# Patient Record
Sex: Male | Born: 1960 | Race: Black or African American | Hispanic: No | Marital: Single | Smoking: Former smoker
Health system: Southern US, Community
[De-identification: ages and names within clinical notes are randomized; demographics above are authoritative.]

## PROBLEM LIST (undated history)

## (undated) DIAGNOSIS — S0990XA Unspecified injury of head, initial encounter: Secondary | ICD-10-CM

## (undated) DIAGNOSIS — K219 Gastro-esophageal reflux disease without esophagitis: Secondary | ICD-10-CM

## (undated) DIAGNOSIS — F419 Anxiety disorder, unspecified: Secondary | ICD-10-CM

## (undated) DIAGNOSIS — E785 Hyperlipidemia, unspecified: Secondary | ICD-10-CM

## (undated) DIAGNOSIS — I10 Essential (primary) hypertension: Secondary | ICD-10-CM

## (undated) HISTORY — DX: Unspecified injury of head, initial encounter: S09.90XA

## (undated) HISTORY — DX: Essential (primary) hypertension: I10

## (undated) HISTORY — DX: Anxiety disorder, unspecified: F41.9

## (undated) HISTORY — DX: Hyperlipidemia, unspecified: E78.5

## (undated) HISTORY — DX: Gastro-esophageal reflux disease without esophagitis: K21.9

---

## 1999-03-26 HISTORY — PX: KNEE ARTHROSCOPY W/ ORIF: SUR98

## 2016-04-06 LAB — TSH: TSH: 2.37 u[IU]/mL (ref 0.41–5.90)

## 2016-04-06 LAB — COMPLETE METABOLIC PANEL WITH GFR
Albumin, Serum: 4.6
BUN/Creatinine Ratio: 15
CHLORIDE, SERUM: 102
CO2: 23
Calcium, Ser: 9.3
Hemoglobin A1C: 5.9
Protein S Total: 7.1
VLDL Cholesterol, Calc: 103

## 2016-04-06 LAB — LIPID PANEL
Cholesterol: 176 mg/dL (ref 0–200)
HDL: 47 mg/dL (ref 35–70)
LDL Cholesterol: 103 mg/dL
TRIGLYCERIDES: 130 mg/dL (ref 40–160)

## 2016-04-06 LAB — BASIC METABOLIC PANEL
BUN: 15 mg/dL (ref 4–21)
CREATININE: 1 mg/dL (ref 0.6–1.3)
Glucose: 93 mg/dL
Potassium: 4.1 mmol/L (ref 3.4–5.3)
SODIUM: 141 mmol/L (ref 137–147)

## 2016-04-06 LAB — CBC AND DIFFERENTIAL
HEMOGLOBIN: 14.7 g/dL (ref 13.5–17.5)
Platelets: 261 10*3/uL (ref 150–399)
WBC: 5.7 10^3/mL

## 2016-04-06 LAB — HEPATIC FUNCTION PANEL
ALT: 58 U/L — AB (ref 10–40)
AST: 32 U/L (ref 14–40)
Alkaline Phosphatase: 83 U/L (ref 25–125)
BILIRUBIN, TOTAL: 0.3 mg/dL

## 2016-05-31 ENCOUNTER — Encounter: Payer: Self-pay | Admitting: Family Medicine

## 2016-05-31 ENCOUNTER — Ambulatory Visit (INDEPENDENT_AMBULATORY_CARE_PROVIDER_SITE_OTHER): Payer: Self-pay | Admitting: Family Medicine

## 2016-05-31 ENCOUNTER — Ambulatory Visit (INDEPENDENT_AMBULATORY_CARE_PROVIDER_SITE_OTHER): Payer: Medicaid Other

## 2016-05-31 DIAGNOSIS — E782 Mixed hyperlipidemia: Secondary | ICD-10-CM

## 2016-05-31 DIAGNOSIS — M898X1 Other specified disorders of bone, shoulder: Secondary | ICD-10-CM

## 2016-05-31 DIAGNOSIS — I10 Essential (primary) hypertension: Secondary | ICD-10-CM

## 2016-05-31 DIAGNOSIS — G8929 Other chronic pain: Secondary | ICD-10-CM

## 2016-05-31 DIAGNOSIS — M25561 Pain in right knee: Secondary | ICD-10-CM | POA: Diagnosis not present

## 2016-05-31 DIAGNOSIS — E785 Hyperlipidemia, unspecified: Secondary | ICD-10-CM | POA: Insufficient documentation

## 2016-05-31 DIAGNOSIS — M1711 Unilateral primary osteoarthritis, right knee: Secondary | ICD-10-CM | POA: Diagnosis not present

## 2016-05-31 DIAGNOSIS — R6882 Decreased libido: Secondary | ICD-10-CM

## 2016-05-31 DIAGNOSIS — K219 Gastro-esophageal reflux disease without esophagitis: Secondary | ICD-10-CM

## 2016-05-31 MED ORDER — SIMVASTATIN 40 MG PO TABS: 40.0000 mg | ORAL_TABLET | Freq: Every day | ORAL | 1 refills | Status: AC

## 2016-05-31 MED ORDER — GABAPENTIN 300 MG PO CAPS: 300.0000 mg | ORAL_CAPSULE | Freq: Three times a day (TID) | ORAL | 3 refills | Status: DC

## 2016-05-31 MED ORDER — OMEPRAZOLE 20 MG PO CPDR: 20.0000 mg | DELAYED_RELEASE_CAPSULE | Freq: Every day | ORAL | 3 refills | Status: DC

## 2016-05-31 MED ORDER — LOSARTAN POTASSIUM 50 MG PO TABS: 50.0000 mg | ORAL_TABLET | Freq: Every day | ORAL | 3 refills | Status: AC

## 2016-05-31 MED ORDER — GABAPENTIN 300 MG PO CAPS: 900.0000 mg | ORAL_CAPSULE | Freq: Three times a day (TID) | ORAL | 3 refills | Status: AC

## 2016-05-31 NOTE — Progress Notes (Addendum)
Edward Henry is a 56 y.o. male who presents to [facility]: Primary Care Sports Medicine today for establish care, discuss, HTN, HLD, GERD, Right knee pain, Right scapula pain, and low libedo.   Edward Henry was discharged from prison in September of 2017 after serving around 20 years. While in prison he sustained several injuries [facility] experiences. He suffered a tibial plateau fracture that required ORIF. He's had significant issues with this knee since he has had surgery. He's been told by somebody that the hardware is loose. He received a steroid injection in the right knee a few months ago by another doctor which only lasted for a few days. He notes chronic right knee pain. He's been taking gabapentin for this problem. Previously he was taking 900 mg 3 times a day. His [facility] a [facility] for gabapentin 300 mg 3 times daily. He would like to increase the dose of possible.  Hypertension: Patient has hypertension and has been taking lisinopril. He notes this tends to control his blood pressure but causes a cough. He denies chest pain or palpitations.  Hyperlipidemia: Patient takes simvastatin daily and feels well with no significant muscle aches or pains.  GERD: Patient takes omeprazole 20 mg daily which helps to control his GERD symptoms well.  Right scapula pain: Patient suffered a scapula fracture in 2001 which was treated conservatively. He notes continued chronic pain in the right periscapular area worse with motion.  Decreased libido: Patient notes significantly decreased libido small testicles and decreased erections over the last several years. He notes this is bothersome.   Past Medical History:  Diagnosis Date  . Anxiety   . GERD (gastroesophageal reflux disease)   . Head trauma   . Hyperlipidemia   . Hypertension    Past Surgical History:  Procedure  Laterality Date  . KNEE ARTHROSCOPY W/ ORIF Right 2001   Tibial fx    Social History  Substance Use Topics  . Smoking status: Former Smoker    Types: Cigarettes    Quit date: 04/25/2006  . Smokeless tobacco: Never Used  . Alcohol use Not on file   family history includes Diabetes in his mother; Hypertension in his father.  ROS as above: No new headache, visual changes, nausea, vomiting, diarrhea, constipation, dizziness, abdominal pain, skin rash, fevers, chills, night sweats, weight loss, swollen lymph nodes, body aches, joint swelling, muscle aches, chest pain, shortness of breath, mood changes, visual or auditory hallucinations.    Medications: Current Outpatient Prescriptions  Medication Sig Dispense Refill  . gabapentin (NEURONTIN) 300 MG capsule Take 3 capsules (900 mg total) by mouth 3 (three) times daily. 810 capsule 3  . omeprazole (PRILOSEC) 20 MG capsule Take 1 capsule (20 mg total) by mouth daily. 90 capsule 3  . prazosin (MINIPRESS) 5 MG capsule Take 5 mg by mouth at bedtime.    . sertraline (ZOLOFT) 100 MG tablet Take 100 mg by mouth daily.    . simvastatin (ZOCOR) 40 MG tablet Take 1 tablet (40 mg total) by mouth daily. 90 tablet 1  . traZODone (DESYREL) 100 MG tablet Take 100 mg by mouth at bedtime.    Marland Kitchen losartan (COZAAR) 50 MG tablet Take 1 tablet (50 mg total) by mouth daily. 90 tablet 3   No current facility-administered medications for this visit.    Not on File  Health Maintenance Health Maintenance  Topic Date Due  . Hepatitis C Screening  24-Nov-1960  .  HIV Screening  11/05/1975  . TETANUS/TDAP  11/05/1979  . COLONOSCOPY  11/05/2010  . INFLUENZA VACCINE  10/24/2015     Exam:  BP (!) 136/92   Pulse 98   Ht 6\' 1"  (1.854 m)   Wt 264 lb (119.7 kg)   SpO2 97%   BMI 34.83 kg/m   Gen: Well NAD HEENT: EOMI,  MMM Lungs: Normal work of breathing. CTABL Heart: RRR no MRG Abd: NABS, Soft. Nondistended, Nontender Exts: Brisk capillary refill, warm and  well perfused.  Right knee: Large mature scar along the anterior aspect of the knee with mild effusion present. Decreased right-sided quadriceps bulk. Range of motion 5-100 with crepitations on extension. Patient ambulates with an antalgic gait using a cane.  Depression screen PHQ 2/9 05/31/2016  Decreased Interest 3  Down, Depressed, Hopeless 3  PHQ - 2 Score 6  Altered sleeping 3  Tired, decreased energy 3  Change in appetite 2  Feeling bad or failure about yourself  1  Trouble concentrating 3  Moving slowly or fidgety/restless 3  Suicidal thoughts 0  PHQ-9 Score 21     No results found for this or any previous visit (from the past 72 hour(s)). No results found.    Assessment and Plan: 56 y.o. male with  Right knee pain: Status post plateau fracture with ORIF. X-ray pending anticipate severe end-stage DJD. Patient will likely require TKR. Patient will return to clinic in one week to go over results. Plan to increase gabapentin to 900 mg 3 times daily as he was tolerating this previously. Follow-up in one week.  Scapula pain: Status post Fracture x-ray pending. Refer to physical therapy. Recheck in 1 week.  Hypertension: Blood pressure not well-controlled. Plan to switch to losartan 50 mg daily and check basic fasting labs in the near future. Follow-up in one week.  Hyperlipidemia: Continue current regimen check fasting lipids follow-up in one week.  GERD: Continue current regimen.  Psych: Patient has been seen a community psychiatrist recently for his psychiatric disorders following his incarceration. It looks like he's being treated for insomnia depression and possibly anxiety. Dr. Lilyan Punt is prescribing zoloft, trazodone and prazosin. Will try to obtain medical records.    Orders Placed This Encounter  Procedures  . DG Knee 1-2 Views Left    Standing Status:   Future    Number of Occurrences:   1    Standing Expiration Date:   08/01/2017    Order Specific  Question:   Reason for Exam (SYMPTOM  OR DIAGNOSIS REQUIRED)    Answer:   For use with right knee x-ray, bilateral AP and Rosenberg standing.    Order Specific Question:   Preferred imaging location?    Answer:   [facility]  . DG Knee Complete 4 Views Right    Please include patellar sunrise, lateral, and weightbearing bilateral AP and bilateral rosenberg views    Standing Status:   Future    Number of Occurrences:   1    Standing Expiration Date:   07/31/2017    Order Specific Question:   Reason for exam:    Answer:   Please include patellar sunrise, lateral, and weightbearing bilateral AP and bilateral rosenberg views    Comments:   Please include patellar sunrise, lateral, and weightbearing bilateral AP and bilateral rosenberg views    Order Specific Question:   Preferred imaging location?    Answer:   [facility]  . DG Scapula Right    Standing Status:  Future    Number of Occurrences:   1    Standing Expiration Date:   07/31/2017    Order Specific Question:   Reason for Exam (SYMPTOM  OR DIAGNOSIS REQUIRED)    Answer:   eval old fracture    Order Specific Question:   [facility]?    [facility]  . CBC  . COMPLETE METABOLIC PANEL WITH GFR  . Lipid Panel w/reflex Direct LDL  . VITAMIN D 25 Hydroxy (Vit-D Deficiency, Fractures)  . HIV antibody  . Testosterone  . TSH  . PSA  . Ambulatory referral to Physical Therapy    Referral Priority:   Routine    Referral Type:   Physical Medicine    Referral Reason:   Specialty Services Required    Requested Specialty:   Physical Therapy    Number of Visits Requested:   1   Meds ordered this encounter  Medications  . simvastatin (ZOCOR) 40 MG tablet    Sig: Take 1 tablet (40 mg total) by mouth daily.    Dispense:  90 tablet    Refill:  1  . losartan (COZAAR) 50 MG tablet    Sig: Take 1 tablet (50 mg total) by mouth daily.    Dispense:  90 tablet    Refill:  3  . DISCONTD:  gabapentin (NEURONTIN) 300 MG capsule    Sig: Take 1 capsule (300 mg total) by mouth 3 (three) times daily. One tab PO qHS for a week, then BID for a week, then TID. May double weekly to a max of 3,600mg /day    Dispense:  270 capsule    Refill:  3  . omeprazole (PRILOSEC) 20 MG capsule    Sig: Take 1 capsule (20 mg total) by mouth daily.    Dispense:  90 capsule    Refill:  3  . gabapentin (NEURONTIN) 300 MG capsule    Sig: Take 3 capsules (900 mg total) by mouth 3 (three) times daily.    Dispense:  810 capsule    Refill:  3  . sertraline (ZOLOFT) 100 MG tablet    Sig: Take 100 mg by mouth daily.  . traZODone (DESYREL) 100 MG tablet    Sig: Take 100 mg by mouth at bedtime.  . prazosin (MINIPRESS) 5 MG capsule    Sig: Take 5 mg by mouth at bedtime.     Discussed warning signs or symptoms. Please see discharge instructions. Patient expresses understanding.

## 2016-05-31 NOTE — Patient Instructions (Signed)
Thank you for coming in today. Get xray today on your way out.  Get fasting labs soon.   Return in 1 week.  We will go over all results and xrays and work on the other problems.   You can increase the gabapentin to 3 pills three times daily.   Return sooner if needed.

## 2016-06-03 ENCOUNTER — Telehealth: Payer: Self-pay | Admitting: *Deleted

## 2016-06-03 NOTE — Telephone Encounter (Signed)
Pre Authorization approved through [employer]. Approval # is [id number]. Called # in chart and its vm states "Dondra SpryGail". Left generic message that medication was approved through the insurance. The pharm that sent the PA was [facility] in [place] [address] so will call that pharm to let them know.

## 2016-06-06 LAB — COMPLETE METABOLIC PANEL WITH GFR
ALT: 39 U/L (ref 9–46)
AST: 28 U/L (ref 10–35)
Albumin: 4.3 g/dL (ref 3.6–5.1)
Alkaline Phosphatase: 81 U/L (ref 40–115)
BILIRUBIN TOTAL: 0.5 mg/dL (ref 0.2–1.2)
BUN: 11 mg/dL (ref 7–25)
CALCIUM: 8.9 mg/dL (ref 8.6–10.3)
CHLORIDE: 106 mmol/L (ref 98–110)
CO2: 29 mmol/L (ref 20–31)
CREATININE: 1.05 mg/dL (ref 0.70–1.33)
GFR, Est African American: >89 mL/min (ref >=60)
GFR, Est Non African American: 80 mL/min (ref >=60)
Glucose, Bld: 100 mg/dL — ABNORMAL HIGH (ref 65–99)
Potassium: 4.1 mmol/L (ref 3.5–5.3)
Sodium: 141 mmol/L (ref 135–146)
TOTAL PROTEIN: 6.7 g/dL (ref 6.1–8.1)

## 2016-06-06 LAB — TSH: TSH: 1.33 mIU/L (ref 0.40–4.50)

## 2016-06-06 LAB — CBC
HEMATOCRIT: 42.5 % (ref 38.5–50.0)
Hemoglobin: 14 g/dL (ref 13.2–17.1)
MCH: 29.5 pg (ref 27.0–33.0)
MCHC: 32.9 g/dL (ref 32.0–36.0)
MCV: 89.5 fL (ref 80.0–100.0)
MPV: 9.7 fL (ref 7.5–12.5)
PLATELETS: 248 10*3/uL (ref 140–400)
RBC: 4.75 MIL/uL (ref 4.20–5.80)
RDW: 13.7 % (ref 11.0–15.0)
WBC: 3.9 10*3/uL (ref 3.8–10.8)

## 2016-06-06 LAB — LIPID PANEL W/REFLEX DIRECT LDL
Cholesterol: 174 mg/dL (ref <200)
HDL: 38 mg/dL — AB (ref >40)
LDL-CHOLESTEROL: 113 mg/dL — AB
NON-HDL CHOLESTEROL (CALC): 136 mg/dL — AB (ref <130)
TRIGLYCERIDES: 120 mg/dL (ref <150)
Total CHOL/HDL Ratio: 4.6 Ratio (ref <5.0)

## 2016-06-06 LAB — TESTOSTERONE: TESTOSTERONE: 378 ng/dL (ref 250–827)

## 2016-06-06 LAB — PSA: PSA: 0.3 ng/mL (ref <=4.0)

## 2016-06-06 LAB — VITAMIN D 25 HYDROXY (VIT D DEFICIENCY, FRACTURES): Vit D, 25-Hydroxy: 18 ng/mL — ABNORMAL LOW (ref 30–100)

## 2016-06-06 LAB — HIV ANTIBODY (ROUTINE TESTING W REFLEX): HIV 1&2 Ab, 4th Generation: NONREACTIVE

## 2016-06-07 ENCOUNTER — Ambulatory Visit (INDEPENDENT_AMBULATORY_CARE_PROVIDER_SITE_OTHER): Payer: Medicaid Other | Admitting: Family Medicine

## 2016-06-07 ENCOUNTER — Encounter: Payer: Self-pay | Admitting: Family Medicine

## 2016-06-07 VITALS — BP 123/70 | HR 100 | Wt 267.0 lb

## 2016-06-07 DIAGNOSIS — G8929 Other chronic pain: Secondary | ICD-10-CM | POA: Diagnosis not present

## 2016-06-07 DIAGNOSIS — M25561 Pain in right knee: Secondary | ICD-10-CM

## 2016-06-07 NOTE — Patient Instructions (Addendum)
Thank you for coming in today. Work on right knee strength.  Do straight leg raises multiple times per day.  Try to use an exercise bike.  Follow up after the bone scan.  Continue medicines.   Recheck ortho.  Keep working on primary care doctor medicaid.   [facility] [address] [place], [place] [id number] [phone]

## 2016-06-07 NOTE — Progress Notes (Signed)
Edward Henry is a 56 y.o. male who presents to [facility]: Primary Care Sports Medicine today for follow-up right knee pain. Patient was seen last week to establish care for knee pain. Additionally his chronic health conditions were addressed but patient is aware that he will need to establish care with a clinic today except medicated for primary care reasons. I'm happy to see him for his orthopedic/sports medicine needs.  Right knee pain: As noted previously patient had what appears to be a proximal fibula and tibia plateau fracture treated surgically about 15 years ago while in prison. He had what seems like Limited physical therapy access.  He notes chronic right knee pain with decreased motion. He uses a cane to ambulate. He had an injection a few months ago by orthopedic surgeon which lasted a few days. He's been told that the hardware is loosen his knee based on physical exam findings several years ago but has not had proper diagnostic testing yet.   Past Medical History:  Diagnosis Date  . Anxiety   . GERD (gastroesophageal reflux disease)   . Head trauma   . Hyperlipidemia   . Hypertension    Past Surgical History:  Procedure Laterality Date  . KNEE ARTHROSCOPY W/ ORIF Right 2001   Tibial fx    Social History  Substance Use Topics  . Smoking status: Former Smoker    Types: Cigarettes    Quit date: 04/25/2006  . Smokeless tobacco: Never Used  . Alcohol use Not on file   family history includes Diabetes in his mother; Hypertension in his father.  ROS as above:  Medications: Current Outpatient Prescriptions  Medication Sig Dispense Refill  . gabapentin (NEURONTIN) 300 MG capsule Take 3 capsules (900 mg total) by mouth 3 (three) times daily. 810 capsule 3  . losartan (COZAAR) 50 MG tablet Take 1 tablet (50 mg total) by mouth daily. 90 tablet 3  . omeprazole (PRILOSEC) 20 MG  capsule Take 1 capsule (20 mg total) by mouth daily. 90 capsule 3  . prazosin (MINIPRESS) 5 MG capsule Take 5 mg by mouth at bedtime.    . sertraline (ZOLOFT) 100 MG tablet Take 100 mg by mouth daily.    . simvastatin (ZOCOR) 40 MG tablet Take 1 tablet (40 mg total) by mouth daily. 90 tablet 1  . traZODone (DESYREL) 100 MG tablet Take 100 mg by mouth at bedtime.     No current facility-administered medications for this visit.    Not on File  Health Maintenance Health Maintenance  Topic Date Due  . Hepatitis C Screening  30-Mar-1960  . TETANUS/TDAP  11/05/1979  . COLONOSCOPY  11/05/2010  . INFLUENZA VACCINE  10/24/2015  . HIV Screening  Completed     Exam:  BP 123/70   Pulse 100   Wt 267 lb (121.1 kg)   BMI 35.23 kg/m  Gen: Well NAD HEENT: EOMI,  MMM Lungs: Normal work of breathing. CTABL Heart: RRR no MRG Abd: NABS, Soft. Nondistended, Nontender Exts: Brisk capillary refill, warm and well perfused.  Right knee: Well-appearing mature scar along the anterior aspect of the knee. Significant decreased quadricep bulk. No effusion. Diffusely tender. Decreased motion 5-90 with crepitations on extension. Stable ligaments exam however patient guards significantly with exam.  Patient is significantly weak to quadricep strength. He is unable to complete a straight leg raise test.   Results for orders placed or performed in visit on 05/31/16 (from the past 72  hour(s))  CBC     Status: None   Collection Time: 06/05/16 11:29 AM  Result Value Ref Range   WBC 3.9 3.8 - 10.8 K/uL   RBC 4.75 4.20 - 5.80 MIL/uL   Hemoglobin 14.0 13.2 - 17.1 g/dL   HCT 96.042.5 45.438.5 - 09.850.0 %   MCV 89.5 80.0 - 100.0 fL   MCH 29.5 27.0 - 33.0 pg   MCHC 32.9 32.0 - 36.0 g/dL   RDW 11.913.7 14.711.0 - 82.915.0 %   Platelets 248 140 - 400 K/uL   MPV 9.7 7.5 - 12.5 fL  COMPLETE METABOLIC PANEL WITH GFR     Status: Abnormal   Collection Time: 06/05/16 11:29 AM  Result Value Ref Range   Sodium 141 135 - 146 mmol/L    Potassium 4.1 3.5 - 5.3 mmol/L   Chloride 106 98 - 110 mmol/L   CO2 29 20 - 31 mmol/L   Glucose, Bld 100 (H) 65 - 99 mg/dL   BUN 11 7 - 25 mg/dL   Creat 5.621.05 1.300.70 - 8.651.33 mg/dL    Comment:   For patients > or = 56 years of age: The upper reference limit for Creatinine is approximately 13% higher for people identified as African-American.      Total Bilirubin 0.5 0.2 - 1.2 mg/dL   Alkaline Phosphatase 81 40 - 115 U/L   AST 28 10 - 35 U/L   ALT 39 9 - 46 U/L   Total Protein 6.7 6.1 - 8.1 g/dL   Albumin 4.3 3.6 - 5.1 g/dL   Calcium 8.9 8.6 - 78.410.3 mg/dL   GFR, Est African American >89 >=60 mL/min   GFR, Est Non African American 80 >=60 mL/min  Lipid Panel w/reflex Direct LDL     Status: Abnormal   Collection Time: 06/05/16 11:29 AM  Result Value Ref Range   Cholesterol 174 <200 mg/dL   Triglycerides 696120 <295<150 mg/dL   HDL 38 (L) >28>40 mg/dL   Total CHOL/HDL Ratio 4.6 <5.0 Ratio   Non-HDL Cholesterol (Calc) 136 (H) <130 mg/dL    Comment:   For patients with diabetes plus 1 major ASCVD risk factor, treating to a non-HDL-C goal of <100 mg/dL (LDL-C of <41<70 mg/dL) is considered a therapeutic option.      LDL-Cholesterol 113 (H) mg/dL    Comment: Reference range: <100   Desirable range <100 mg/dL for patients with CHD or diabetes and <70 mg/dL for diabetic patients with known heart disease.     The Martin-Hopkins calculation is a validated novel method that provides better accuracy than the Friedwald equation in the estimation of LDL-C, particulary when TG levels are 150-400 mg/dL and LDL-C levels are lower than 70 mg/dL. Reference:  Horald PollenMartin SS et al.  Comparison of a Novel Method vs the Lauralee EvenerFriedwald Equation for Estimating Low-Density Lipoprotein Cholesterol Levels From the Standard Lipid Profile.  JAMA. 3244;010(272013;310(19): 2061-2068.   For additional information, please refer to http://education.[facility].com/faq/FAQ164 (This link is being provided for informational/educational  purposes only.)   VITAMIN D 25 Hydroxy (Vit-D Deficiency, Fractures)     Status: Abnormal   Collection Time: 06/05/16 11:29 AM  Result Value Ref Range   Vit D, 25-Hydroxy 18 (L) 30 - 100 ng/mL    Comment: Vitamin D Status           25-OH Vitamin D        Deficiency                <20 ng/mL  Insufficiency         20 - 29 ng/mL        Optimal             > or = 30 ng/mL   For 25-OH Vitamin D testing on patients on D2-supplementation and patients for whom quantitation of D2 and D3 fractions is required, the [facility] 25-OH VIT D, (D2,D3), LC/MS/MS is recommended: order code 16109 (patients > 2 yrs).   HIV antibody     Status: None   Collection Time: 06/05/16 11:29 AM  Result Value Ref Range   HIV 1&2 Ab, 4th Generation NONREACTIVE NONREACTIVE    Comment:   HIV-1 antigen and HIV-1/HIV-2 antibodies were not detected.  There is no laboratory evidence of HIV infection.   HIV-1/2 Antibody Diff        Not indicated. HIV-1 RNA, Qual TMA          Not indicated.     PLEASE NOTE: This information has been disclosed to you from records whose confidentiality may be protected by state law. If your state requires such protection, then the state law prohibits you from making any further disclosure of the information without the specific written consent of the person to whom it pertains, or as otherwise permitted by law. A general authorization for the release of medical or other information is NOT sufficient for this purpose.   The performance of this assay has not been clinically validated in patients less than 53 years old.   For additional information please refer to http://education.[facility].com/faq/FAQ106.  (This link is being provided for informational/educational purposes only.)     Testosterone     Status: None   Collection Time: 06/05/16 11:30 AM  Result Value Ref Range   Testosterone 378 250 - 827 ng/dL  TSH     Status: None   Collection Time: 06/05/16 11:30  AM  Result Value Ref Range   TSH 1.33 0.40 - 4.50 mIU/L  PSA     Status: None   Collection Time: 06/05/16 11:30 AM  Result Value Ref Range   PSA 0.3 <=4.0 ng/mL    Comment:   The total PSA value from this assay system is standardized against the WHO standard. The test result will be approximately 20% lower when compared to the equimolar-standardized total PSA (Beckman Coulter). Comparison of serial PSA results should be interpreted with this fact in mind.   This test was performed using the Siemens chemiluminescent method. Values obtained from different assay methods cannot be used interchangeably. PSA levels, regardless of value, should not be interpreted as absolute evidence of the presence or absence of disease.      No results found.  Study Result   CLINICAL DATA:  Chronic right knee pain with worsening  EXAM: RIGHT KNEE - COMPLETE 4+ VIEW  COMPARISON:  None.  FINDINGS: The patient is status post surgical plate and multiple screw fixation of the proximal tibia with grossly intact hardware. Old fracture deformity of the proximal fibula. Mild patellofemoral degenerative changes. Mild to moderate narrowing of the medial joint space compartment. Mild narrowing of the lateral joint space compartment. No large effusion.  IMPRESSION: Mild to moderate arthritis of the right knee without acute abnormality. Status post surgical plate and screw fixation of the proximal tibia with old fracture deformities of the proximal fibula and proximal tibia.   Electronically Signed   By: Jasmine Pang M.D.   On: 05/31/2016 14:21     Assessment and Plan: 56 y.o. 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 Plan for 3 phase bone scan to evaluate for hardware loosening. Return after exam. Plan patient will work on straight leg raise strength exercises as well as simple knee range of motion exercises on an exercise bike.   Orders Placed  This Encounter  Procedures  . NM Bone Scan 3 Phase    Standing Status:   Future    Standing Expiration Date:   08/07/2017    Order Specific Question:   Reason for Exam (SYMPTOM  OR DIAGNOSIS REQUIRED)    Answer:   eval hardware    Order Specific Question:   Preferred imaging location?    Answer:   [facility]   No orders of the defined types were placed in this encounter.    Discussed warning signs or symptoms. Please see discharge instructions. Patient expresses understanding.

## 2016-06-20 ENCOUNTER — Ambulatory Visit: Payer: Medicaid Other | Admitting: Physical Therapy

## 2016-06-25 ENCOUNTER — Encounter: Payer: Self-pay | Admitting: Family Medicine

## 2016-07-05 ENCOUNTER — Encounter (HOSPITAL_COMMUNITY)
Admission: RE | Admit: 2016-07-05 | Discharge: 2016-07-05 | Disposition: A | Discharge status: Home / Self Care | Payer: Medicaid Other | Source: Ambulatory Visit | Attending: Family Medicine | Admitting: Family Medicine

## 2016-07-05 DIAGNOSIS — M25561 Pain in right knee: Secondary | ICD-10-CM | POA: Insufficient documentation

## 2016-07-05 DIAGNOSIS — G8929 Other chronic pain: Secondary | ICD-10-CM | POA: Diagnosis present

## 2016-07-05 MED ORDER — TECHNETIUM TC 99M MEDRONATE IV KIT
25.0000 | PACK | Freq: Once | INTRAVENOUS | Status: AC | PRN
  Administered 2016-07-05: 25 via INTRAVENOUS

## 2016-11-27 ENCOUNTER — Other Ambulatory Visit: Payer: Self-pay

## 2016-11-27 MED ORDER — OMEPRAZOLE 20 MG PO CPDR: 20.0000 mg | DELAYED_RELEASE_CAPSULE | Freq: Every day | ORAL | 3 refills | Status: AC

## 2018-04-29 IMAGING — DX DG SCAPULA*R*
2 series · 2 of 2 positions shown · non-contrast
Comparison: None.

CLINICAL DATA: Injured scapula in 9332 and had a fracture

EXAM:
RIGHT SCAPULA - 2+ VIEWS

[scapula ap]
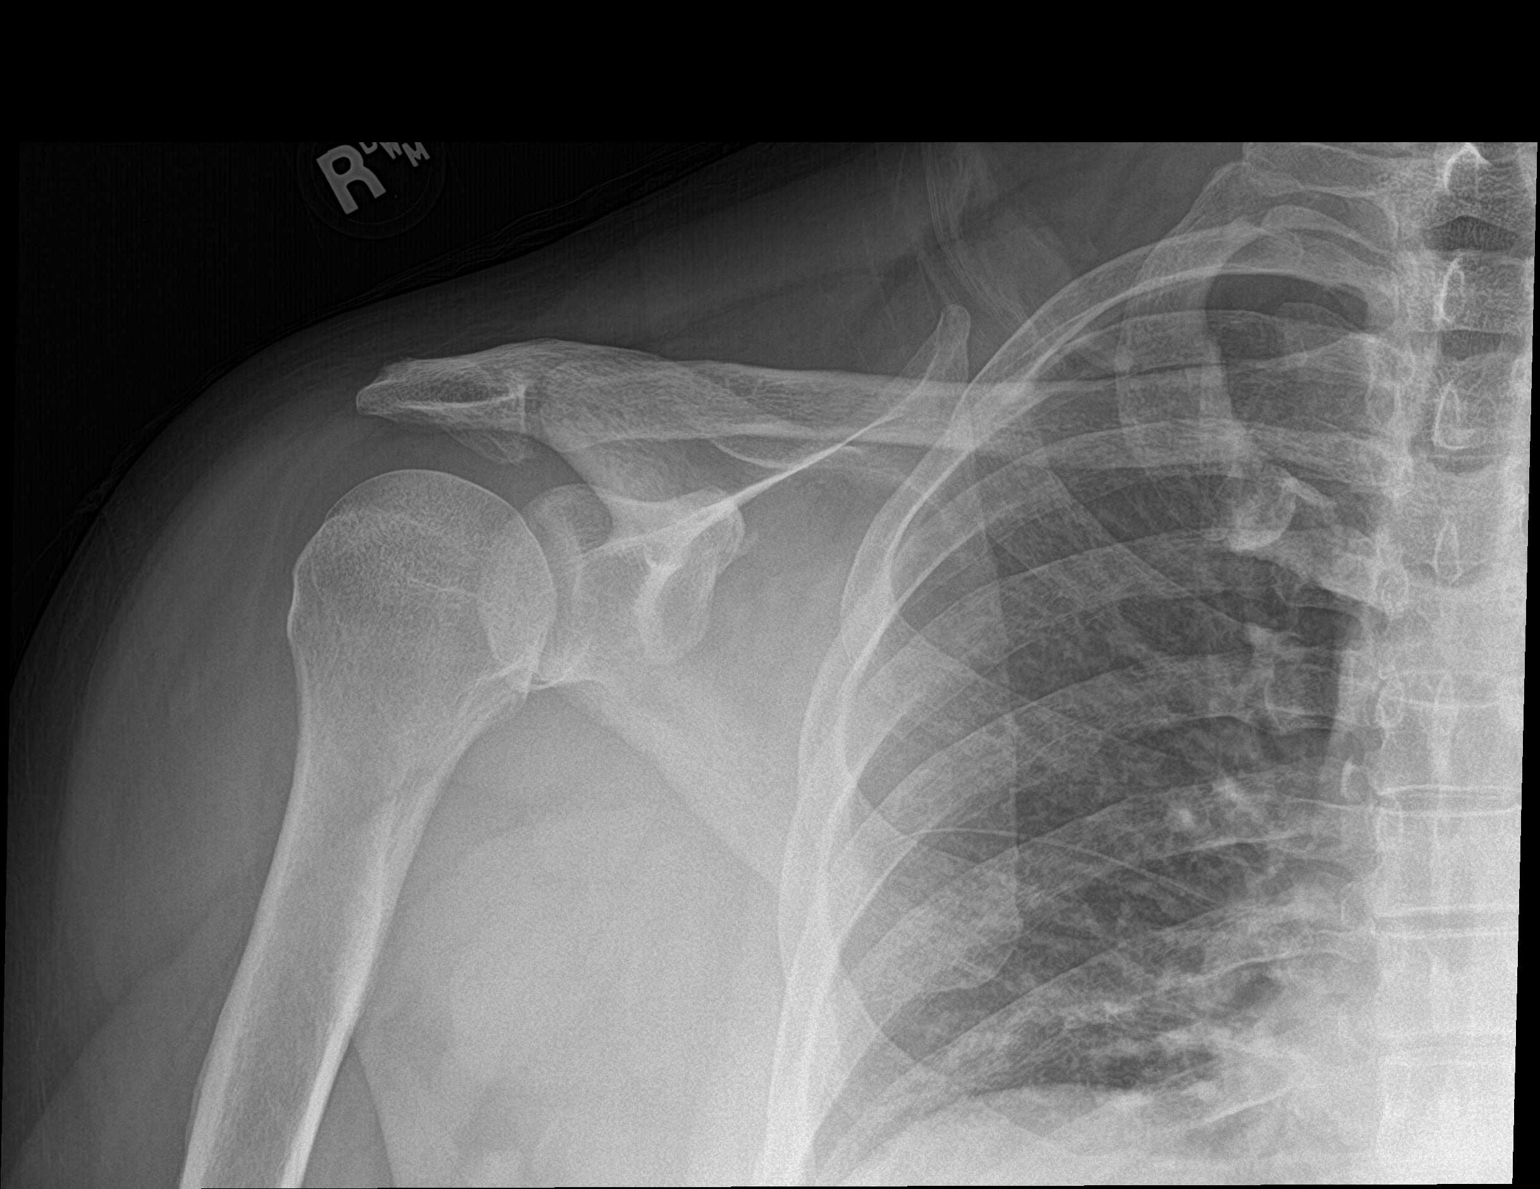

[scapula lat]
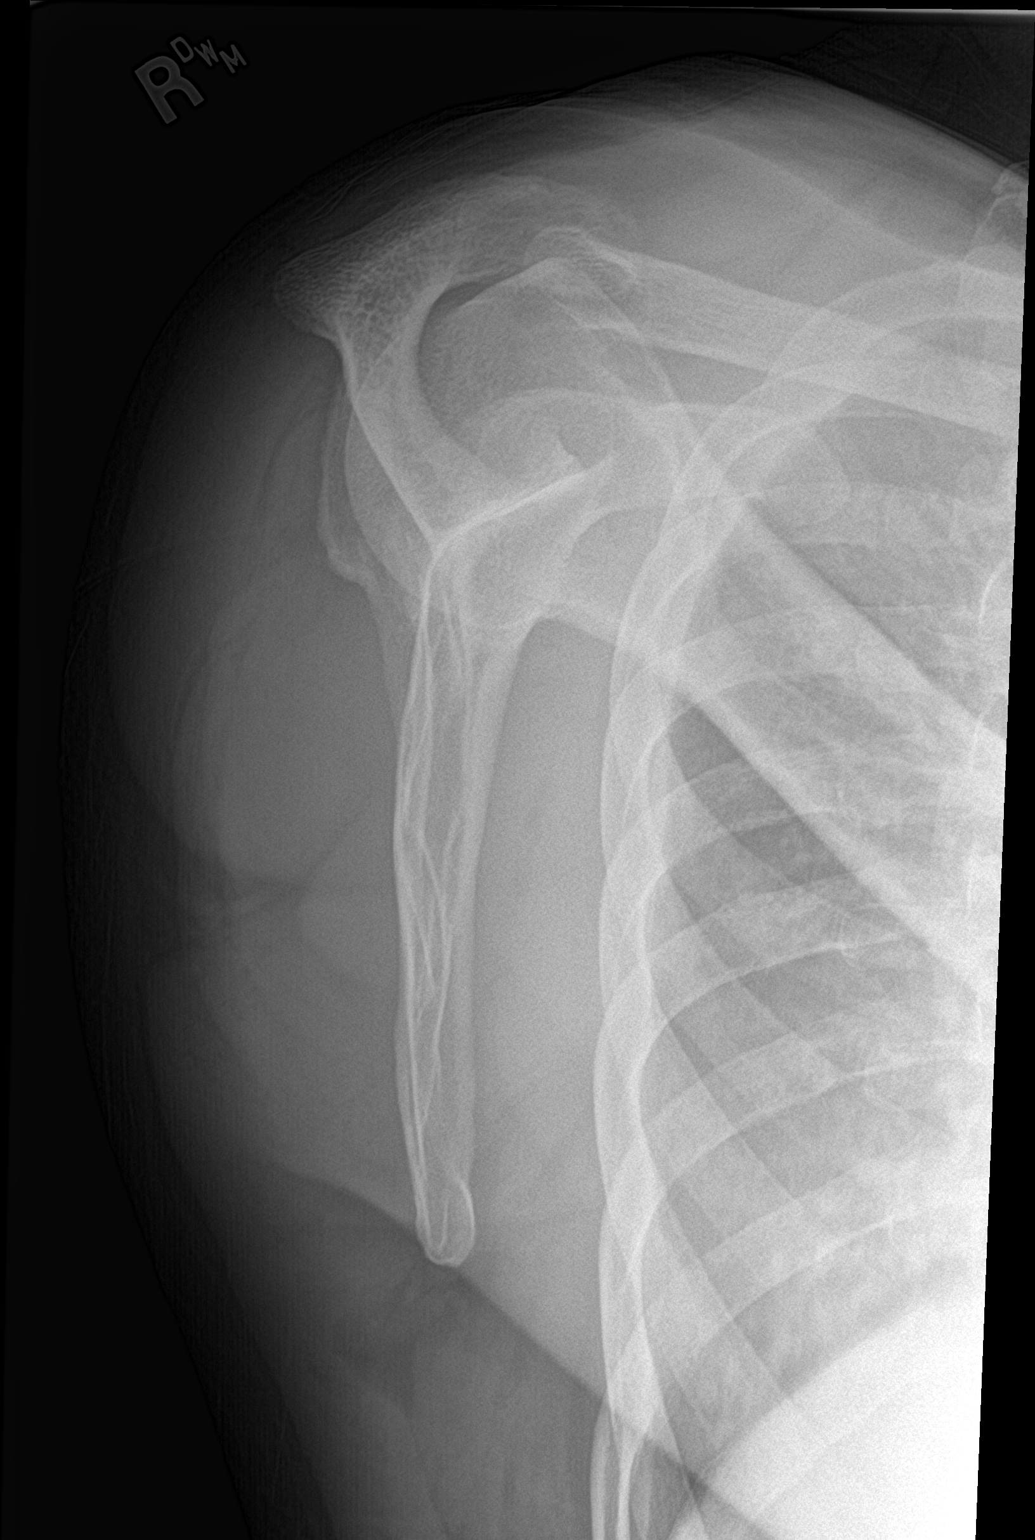

[2 of 2 positions shown; findings below may reference images not displayed]

FINDINGS: Glenohumeral joint appears intact. The AC joint is within normal
limits. Right lung apex is clear. The scapula appears intact and
without significant deformity.
IMPRESSION: Negative.
